# Patient Record
Sex: Female | Born: 1995 | Race: White | Hispanic: No | Marital: Single | Smoking: Never smoker
Health system: Southern US, Community
[De-identification: ages and names within clinical notes are randomized; demographics above are authoritative.]

---

## 2017-01-17 ENCOUNTER — Ambulatory Visit (INDEPENDENT_AMBULATORY_CARE_PROVIDER_SITE_OTHER): Payer: Managed Care, Other (non HMO) | Admitting: Family Medicine

## 2017-01-17 ENCOUNTER — Encounter: Payer: Self-pay | Admitting: Family Medicine

## 2017-01-17 ENCOUNTER — Ambulatory Visit (INDEPENDENT_AMBULATORY_CARE_PROVIDER_SITE_OTHER): Payer: Managed Care, Other (non HMO)

## 2017-01-17 VITALS — BP 126/62 | HR 118 | Temp 98.5°F | Ht 62.0 in | Wt 108.2 lb

## 2017-01-17 DIAGNOSIS — R0789 Other chest pain: Secondary | ICD-10-CM | POA: Diagnosis not present

## 2017-01-17 DIAGNOSIS — Z0001 Encounter for general adult medical examination with abnormal findings: Secondary | ICD-10-CM | POA: Diagnosis not present

## 2017-01-17 DIAGNOSIS — Z0000 Encounter for general adult medical examination without abnormal findings: Secondary | ICD-10-CM

## 2017-01-17 LAB — COMPREHENSIVE METABOLIC PANEL
ALT: 10 U/L (ref 0–35)
AST: 13 U/L (ref 0–37)
Albumin: 4.8 g/dL (ref 3.5–5.2)
Alkaline Phosphatase: 56 U/L (ref 39–117)
BUN: 13 mg/dL (ref 6–23)
CO2: 28 mEq/L (ref 19–32)
Calcium: 9.5 mg/dL (ref 8.4–10.5)
Chloride: 104 mEq/L (ref 96–112)
Creatinine, Ser: 0.85 mg/dL (ref 0.40–1.20)
GFR: 89.47 mL/min (ref >60.00)
Glucose, Bld: 102 mg/dL — ABNORMAL HIGH (ref 70–99)
Potassium: 4.1 mEq/L (ref 3.5–5.1)
Sodium: 139 mEq/L (ref 135–145)
Total Bilirubin: 0.5 mg/dL (ref 0.2–1.2)
Total Protein: 7.6 g/dL (ref 6.0–8.3)

## 2017-01-17 LAB — CBC WITH DIFFERENTIAL/PLATELET
Basophils Absolute: 0 10*3/uL (ref 0.0–0.1)
Basophils Relative: 0.5 % (ref 0.0–3.0)
Eosinophils Absolute: 0.3 10*3/uL (ref 0.0–0.7)
Eosinophils Relative: 5 % (ref 0.0–5.0)
HCT: 40.8 % (ref 36.0–46.0)
Hemoglobin: 13.9 g/dL (ref 12.0–15.0)
Lymphocytes Relative: 33.6 % (ref 12.0–46.0)
Lymphs Abs: 1.8 10*3/uL (ref 0.7–4.0)
MCHC: 34 g/dL (ref 30.0–36.0)
MCV: 86.2 fl (ref 78.0–100.0)
Monocytes Absolute: 0.4 10*3/uL (ref 0.1–1.0)
Monocytes Relative: 7.5 % (ref 3.0–12.0)
Neutro Abs: 2.9 10*3/uL (ref 1.4–7.7)
Neutrophils Relative %: 53.4 % (ref 43.0–77.0)
Platelets: 261 10*3/uL (ref 150.0–400.0)
RBC: 4.73 Mil/uL (ref 3.87–5.11)
RDW: 12.3 % (ref 11.5–15.5)
WBC: 5.5 10*3/uL (ref 4.0–10.5)

## 2017-01-17 LAB — POCT URINE PREGNANCY: Preg Test, Ur: NEGATIVE

## 2017-01-17 MED ORDER — IBUPROFEN-FAMOTIDINE 800-26.6 MG PO TABS: 1.0000 | ORAL_TABLET | Freq: Two times a day (BID) | ORAL | 0 refills | Status: DC

## 2017-01-19 ENCOUNTER — Telehealth: Payer: Self-pay | Admitting: Family Medicine

## 2017-01-19 ENCOUNTER — Encounter: Payer: Self-pay | Admitting: Sports Medicine

## 2017-01-19 ENCOUNTER — Ambulatory Visit: Payer: Managed Care, Other (non HMO) | Admitting: Sports Medicine

## 2017-01-19 VITALS — BP 104/68 | HR 100 | Ht 62.0 in | Wt 108.8 lb

## 2017-01-19 DIAGNOSIS — M259 Joint disorder, unspecified: Secondary | ICD-10-CM | POA: Diagnosis not present

## 2017-01-19 DIAGNOSIS — M9901 Segmental and somatic dysfunction of cervical region: Secondary | ICD-10-CM

## 2017-01-19 DIAGNOSIS — M9908 Segmental and somatic dysfunction of rib cage: Secondary | ICD-10-CM

## 2017-01-19 DIAGNOSIS — M25512 Pain in left shoulder: Secondary | ICD-10-CM

## 2017-01-19 DIAGNOSIS — M9902 Segmental and somatic dysfunction of thoracic region: Secondary | ICD-10-CM | POA: Diagnosis not present

## 2017-01-19 DIAGNOSIS — G2589 Other specified extrapyramidal and movement disorders: Secondary | ICD-10-CM | POA: Diagnosis not present

## 2017-01-19 MED ORDER — IBUPROFEN-FAMOTIDINE 800-26.6 MG PO TABS: 1.0000 | ORAL_TABLET | Freq: Three times a day (TID) | ORAL | 2 refills | Status: AC | PRN

## 2017-01-19 NOTE — Procedures (Signed)
PROCEDURE NOTE: THERAPEUTIC EXERCISES (97110) 15 minutes spent for Therapeutic exercises as below and as referenced in the AVS. This included exercises focusing on stretching, strengthening, with significant focus on eccentric aspects.  Proper technique shown and discussed handout in great detail with ATC. All questions were discussed and answered.   Long term goals include an improvement in range of motion, strength, endurance as well as avoiding reinjury. Frequency of visits is one time as determined during today's  office visit. Frequency of exercises to be performed is as per handout.  EXERCISES REVIEWED:  Posterior chain exercises  Stabilization exercises  Goodman exercises  Anterior chain stretching

## 2017-01-19 NOTE — Assessment & Plan Note (Signed)
I suspect the majority of her symptoms are coming from significant anterior chain dominance with underlying scapular dyskinesis.  If any localizing symptoms to her shoulder further evaluation could be considered for potential underlying labral tear but the crepitation appreciating I believe is [facility].  Therapeutic exercises reviewed in detail per procedure note

## 2017-01-19 NOTE — Telephone Encounter (Signed)
Pt returned call and lab results given to her per pcp request.

## 2017-01-19 NOTE — Patient Instructions (Addendum)
Also check out [employer]"Foundation Training" which is a program developed by Dr. Myles LippsEric Goodman.   There are links to a couple of his YouTube Videos below and I would like to see performing one of his videos 5-6 days per week.    A good intro video is: "Independence from Pain 7-minute Video" - [email or web address]   His more advanced video is: "Powerful Posture and Pain Relief: 12 minutes of Foundation Training" - https://youtu.be/[email or web address]  Do not try to attempt this entire video when first beginning.    Try breaking of each exercise that he goes into shorter segments.  Otherwise if they perform an exercise for 45 seconds, start with 15 seconds and rest and then resume when they begin the new activity.    If you work your way up to doing this 12 minute video, I expect you will see significant improvements in your pain.  If you enjoy his videos and would like to find out more you can look on his website: [email or web address].  He has a workout streaming option as well as a DVD set available for purchase.  Amazon has the best price for his DVDs.     Please perform the exercise program that we have prepared for you and gone over in detail on a daily basis.  In addition to the handout you were provided you can access your program through: [email or web address]   Your unique program code is: [email or web address]

## 2017-01-19 NOTE — Assessment & Plan Note (Signed)
Left AC joint is dysfunctional with likely laxity secondary to repeat self manipulation.  Recommend trying to minimize how often she is doing this with focus on posterior chain recruitment exercises.  We will plan to have her follow-up in 3 weeks for repeat evaluation and consideration of repeat osteopathic manipulation.

## 2017-01-19 NOTE — Progress Notes (Signed)
Cassandra Martin. [facility] [phone]  Cassandra Martin - 22 y.o. female MRN [id number]  Date of birth: 1995-08-19  Visit Date: 01/19/2017  PCP: Helane Rima, DO   Referred by: Helane Rima, DO   Scribe for today's visit: Christoper Fabian, LAT, ATC     SUBJECTIVE:  Cassandra Martin is here for New Patient (Initial Visit) (L chest and rib pain) .  Referred by: Dr. Earlene Plater  Her L chest and rib symptoms INITIALLY: Began Sept 2018 w/ no known MOI. Described as 3/10 aching pain but will get sharp when she "pops" her L ribs/chest, nonradiating Worsened with sleeping on her L side Improved with popping/cracking her L chest.  She describes self-manipulation by rotating her trunk to the L. Additional associated symptoms include: Tightness and discomfort but no significant shortness of breath dizziness, lightheadedness or exertional component    At this time symptoms show no change compared to onset  She has been taking Duexis prescribed by Dr. Earlene Plater on 01/17/17.     ROS Denies night time disturbances. Denies fevers, chills, or night sweats. Denies unexplained weight loss. Denies personal history of cancer. Denies changes in bowel or bladder habits. Denies recent unreported falls. Denies new or worsening dyspnea or wheezing. Denies headaches or dizziness.  Denies numbness, tingling or weakness  In the extremities.  Denies dizziness or presyncopal episodes Denies lower extremity edema     HISTORY & PERTINENT PRIOR DATA:  Prior History reviewed and updated per electronic medical record.  Significant history, findings, studies and interim changes include:  reports that  has never smoked. she has never used smokeless tobacco. No results for input(s): HGBA1C, LABURIC, CREATINE in the last 8760 hours. No specialty comments available. Problem  Disorder of Sternoclavicular Joint  Scapular Dyskinesis    OBJECTIVE:  VS:   HT:5\' 2"  (157.5 cm)   WT:108 lb 12.8 oz (49.4 kg)  BMI:19.89    BP:104/68  HR:100bpm  TEMP: ( )  RESP:99 %   PHYSICAL EXAM: Constitutional: WDWN, Non-toxic appearing. Psychiatric: Alert & appropriately interactive. Not depressed or anxious appearing. Respiratory: No increased work of breathing. Trachea Midline Eyes: Pupils are equal. EOM intact without nystagmus. No scleral icterus Cardiovascular:  Peripheral Pulses: peripheral pulses symmetrical No clubbing or cyanosis appreciated Capillary Refill is normal, less than 2 seconds No signficant generalized edema/anasarca  Sensory Exam: intact to light touch  Left Shoulder Exam: Normal alignment & Contours Skin: No overlying erythema/ecchymosis Neck: normal range of motion and supple Axial loading and circumduction produces: Crepitation with axial loading of the [place] joint as well as the AC joint.  Fine grinding with this but pain-free.  Non tender to palpation over: globally -   Lax shoulder joint in general. Drop arm test: negative Hawkins: normal, no pain  Neers: normal, no pain   Internal Rotation:   with no pain Strength: Normal External Rotation:  ROM: Normal with no pain Strength: Normal  Empty can: normal, no pain Strength: 4/5 Speeds: normal, no pain Strength: 4/5 O'Briens: normal, no pain Strength: 4/5  Significant scapular dyskinesis with abduction.  Anterior chain dominance.  No additional findings.   ASSESSMENT & PLAN:   1. Acute pain of left shoulder   2. Disorder of sternoclavicular joint   3. Scapular dyskinesis   4. Segmental and somatic dysfunction of cervical region   5. Segmental and somatic dysfunction of thoracic region   6. Segmental and somatic dysfunction of rib cage  PLAN:    Scapular dyskinesis I suspect the majority of her symptoms are coming from significant anterior chain dominance with underlying scapular dyskinesis.  If any localizing symptoms to her shoulder further evaluation could be  considered for potential underlying labral tear but the crepitation appreciating I believe is [facility].  Therapeutic exercises reviewed in detail per procedure note  Disorder of sternoclavicular joint Left AC joint is dysfunctional with likely laxity secondary to repeat self manipulation.  Recommend trying to minimize how often she is doing this with focus on posterior chain recruitment exercises.  We will plan to have her follow-up in 3 weeks for repeat evaluation and consideration of repeat osteopathic manipulation.   ++++++++++++++++++++++++++++++++++++++++++++ Orders & Meds: Orders Placed This Encounter  Procedures  . OSTEOPATHIC MANIPULATION TREATMENT  . Misc procedure    Meds ordered this encounter  Medications  . Ibuprofen-Famotidine (DUEXIS) 800-26.6 MG TABS    Sig: Take 1 tablet by mouth 3 (three) times daily as needed. 1 tab po tid X 14 days then 1 tab po tid as needed    Dispense:  90 tablet    Refill:  2    Home Phone      [phone] Mobile          [phone]     ++++++++++++++++++++++++++++++++++++++++++++ Follow-up: Return in about 3 weeks (around 02/09/2017).   Pertinent documentation may be included in additional procedure notes, imaging studies, problem based documentation and patient instructions. Please see these sections of the encounter for additional information regarding this visit. CMA/ATC served as Neurosurgeonscribe during this visit. History, Physical, and Plan performed by medical provider. Documentation and orders reviewed and attested to.      Andrena MewsMichael D Jatavis Malek, DO    [facility]

## 2017-01-19 NOTE — Telephone Encounter (Signed)
Please be advised. No further action required.

## 2017-01-19 NOTE — Procedures (Signed)
PROCEDURE NOTE : OSTEOPATHIC MANIPULATION The decision today to treat with Osteopathic Manipulative Therapy (OMT) was based on physical exam findings. Verbal consent was obtained after after explanation of risks, benefits and potential side effects, including acute pain flare, post manipulation soreness and need for repeat treatments.   Additional time was spent discussing the minimal risk of  injury to neurovascular structures for associated Cervical manipulation.  After verbal consent was obtained manipulation was performed as below:  Contraindications to OMT reviewed and include: NONE.             Regions treated: Per examined regions as below and associated billing codes          Techniques used: [employer], MFR, HVLA and ART The patient tolerated the treatment well and reported Improved symptoms following treatment today. Patient was given medications, exercises, stretches and lifestyle modifications per AVS and verbally.     OSTEOPATHIC/STRUCTURAL EXAM FINDINGS:    C2 rotated right C3 through C5 rotated left, side bent right  C6 FRS right  C7 ERS left  T1 FRS right  T2 through T8 or neutral rotated left, side bent right with a FRS right T5  6 through T8 neutral rotated left, side bent right  Posterior rib 2 on the left  Elevated first rib on the right  SI joint dysfunction and AC joint dysfunction on the left

## 2017-01-21 NOTE — Progress Notes (Signed)
Subjective:    Ike Benemanda Lauf is a 22 y.o. female and is here for a comprehensive physical exam.  Pertinent Gynecological History: Patient's last menstrual period was 01/16/2017 (exact date). Sexually active: No. Menses: regular every month without intermenstrual spotting.  Health Maintenance Due  Topic Date Due  . HIV Screening  09/26/2010  . TETANUS/TDAP  09/26/2014  . PAP SMEAR  09/25/2016   PMHx, SurgHx, SocialHx, Medications, and Allergies were reviewed in the Visit Navigator and updated as appropriate.   History reviewed. No pertinent past medical history. History reviewed. No pertinent surgical history.  Family History  Problem Relation Age of Onset  . Asthma Mother   . Arthritis Father   . Arthritis Maternal Grandmother   . Arthritis Maternal Grandfather   . Cancer Maternal Grandfather   . Cancer Paternal Grandmother   . Arthritis Paternal Grandfather    Social History   Tobacco Use  . Smoking status: Never Smoker  . Smokeless tobacco: Never Used  Substance Use Topics  . Alcohol use: No    Frequency: Never  . Drug use: No   Review of Systems:   Pertinent items are noted in the HPI. Otherwise, ROS is negative.  Objective:   BP 126/62   Pulse (!) 118   Temp 98.5 F (36.9 C) (Oral)   Ht 5\' 2"  (1.575 m)   Wt 108 lb 3.2 oz (49.1 kg)   LMP 01/16/2017 (Exact Date)   SpO2 98%   BMI 19.79 kg/m    Wt Readings from Last 3 Encounters:  01/19/17 108 lb 12.8 oz (49.4 kg)  01/17/17 108 lb 3.2 oz (49.1 kg)     Ht Readings from Last 3 Encounters:  01/19/17 5\' 2"  (1.575 m)  01/17/17 5\' 2"  (1.575 m)   General appearance: alert, cooperative and appears stated age. Head: normocephalic, without obvious abnormality, atraumatic. Neck: no adenopathy, supple, symmetrical, trachea midline; thyroid not enlarged, symmetric, no tenderness/mass/nodules. Lungs: clear to auscultation bilaterally. Heart: regular rate and rhythm Chest wall: mild ttp left costochondral  junction Abdomen: soft, non-tender; no masses,  no organomegaly. Extremities: extremities normal, atraumatic, no cyanosis or edema. Skin: skin color, texture, turgor normal, no rashes or lesions. Lymph: cervical, supraclavicular, and axillary nodes normal; no abnormal inguinal nodes palpated. Neurologic: grossly normal.  Assessment/Plan:   Marchelle Folksmanda was seen today for annual exam.  Diagnoses and all orders for this visit:  Routine physical examination -     CBC with Differential/Platelet -     Comprehensive metabolic panel  Atypical chest pain - chest wall. Comments: Left chest pain, intermittent, feels tight. Made better by rotating her trunk and "popping" her ribs. No initial injury. She sleeps in a fetal position. No history of evaluation or treatment. Exam reassuring, but puzzling. CXR WNL. Will start NSAID and have her follow up with Dr. Berline Choughigby.  Orders: -     DG Chest 2 View - WNL -     POCT urine pregnancy -     Ibuprofen-Famotidine (DUEXIS) 800-26.6 MG TABS; Take 1 tablet by mouth 2 (two) times daily.  Patient Counseling:   [x]     Nutrition: Stressed importance of moderation in sodium/caffeine intake, saturated fat and cholesterol, caloric balance, sufficient intake of fresh fruits, vegetables, fiber, calcium, iron, and 1 mg of folate supplement per day (for females capable of pregnancy).   [x]      Stressed the importance of regular exercise.    [x]     Substance Abuse: Discussed cessation/primary prevention of tobacco, alcohol, or  other drug use; driving or other dangerous activities under the influence; availability of treatment for abuse.    [x]      Injury prevention: Discussed safety belts, safety helmets, smoke detector, smoking near bedding or upholstery.    [x]      Sexuality: Discussed sexually transmitted diseases, partner selection, use of condoms, avoidance of unintended pregnancy  and contraceptive alternatives.    [x]     Dental health: Discussed importance  of regular tooth brushing, flossing, and dental visits.   [x]      Health maintenance and immunizations reviewed. Please refer to Health maintenance section.   Helane Rima, DO [facility]

## 2017-02-10 ENCOUNTER — Ambulatory Visit: Payer: Managed Care, Other (non HMO) | Admitting: Sports Medicine

## 2017-02-10 ENCOUNTER — Encounter: Payer: Self-pay | Admitting: Sports Medicine

## 2017-02-10 VITALS — BP 120/72 | HR 101 | Ht 62.0 in | Wt 112.4 lb

## 2017-02-10 DIAGNOSIS — M25512 Pain in left shoulder: Secondary | ICD-10-CM | POA: Diagnosis not present

## 2017-02-10 DIAGNOSIS — G2589 Other specified extrapyramidal and movement disorders: Secondary | ICD-10-CM | POA: Diagnosis not present

## 2017-02-10 DIAGNOSIS — M259 Joint disorder, unspecified: Secondary | ICD-10-CM

## 2017-02-10 NOTE — Assessment & Plan Note (Signed)
She has had slight improvement with this over the past several weeks.  She has been continuing to perform exercises only intermittently but notices it when she does state and they do seem to be beneficial.  She is continued to have some pain as well as new pain along the right AC joint that is worse with excessive overhead lifting.  This is likely related to continued scapular dyskinesis she has no significant pain with labral testing.  If any lack of improvement will consider further diagnostic evaluation with x-rays and/or MRIs but feels that we can defer this at this time.  Anti-inflammatories as needed.

## 2017-02-10 NOTE — Progress Notes (Signed)
[name]. [name] at [facility] [phone]  Cassandra Martin - 22 y.o. female MRN [id number]  Date of birth: Jan 08, 1996  Visit Date: 02/10/2017  PCP: Helane Rima, DO   Referred by: Helane Rima, DO   Scribe for today's visit: Stevenson Clinch, CMA     SUBJECTIVE:  Cassandra Martin is here for Follow-up (L shoulder and [place] joint pain) .   Notes from initial visit on 01/19/17: Her L chest and rib symptoms INITIALLY: Began Sept 2018 w/ no known MOI. Described as 3/10 aching pain but will get sharp when she "pops" her L ribs/chest, nonradiating Worsened with sleeping on her L side Improved with popping/cracking her L chest.  She describes self-manipulation by rotating her trunk to the L. Additional associated symptoms include: Tightness and discomfort but no significant shortness of breath dizziness, lightheadedness or exertional component    At this time symptoms show no change compared to onset  She has been taking Duexis prescribed by Dr. Earlene Plater on 01/17/17.     Compared to the last office visit on 01/19/17, her previously described L shoulder pain symptoms: are improving, pain is less severe and occurring less often.  Current symptoms are moderate sharp pain & are nonradiating. She continues to have popping.  She has been taking Duexis (twice daily) with minimal relief. She has OMT at last visit but continues to have shoulder pain. She has benefited from home therapeutic exercises.    ROS Denies night time disturbances. Denies fevers, chills, or night sweats. Denies unexplained weight loss. Denies personal history of cancer. Denies changes in bowel or bladder habits. Denies recent unreported falls. Denies new or worsening dyspnea or wheezing. Denies headaches or dizziness.  Denies numbness, tingling or weakness  In the extremities.  Denies dizziness or presyncopal episodes Denies lower extremity edema     HISTORY  & PERTINENT PRIOR DATA:  Prior History reviewed and updated per electronic medical record.  Significant history, findings, studies and interim changes include:  reports that  has never smoked. she has never used smokeless tobacco. No results for input(s): HGBA1C, LABURIC, CREATINE in the last 8760 hours. No specialty comments available. Problem  Disorder of Sternoclavicular Joint    OBJECTIVE:  VS:  HT:5\' 2"  (157.5 cm)   WT:112 lb 6.4 oz (51 kg)  BMI:20.55    BP:120/72  HR:(!) 101bpm  TEMP: ( )  RESP:99 %   PHYSICAL EXAM: Constitutional: WDWN, Non-toxic appearing. Psychiatric: Alert & appropriately interactive.  Not depressed or anxious appearing. Respiratory: No increased work of breathing.  Trachea Midline Eyes: Pupils are equal.  EOM intact without nystagmus.  No scleral icterus  NEUROVASCULAR exam: No clubbing or cyanosis appreciated No significant venous stasis changes Capillary Refill: normal, less than 2 seconds   Left shoulder still held in significant however overall posture is slightly improved.  She has persistent popping is only intermittent and difficult to reproduce on exam today which is improved.  Her shoulder does have significant laxity suggesting possible multidirectional instability.  Positive jerk test and Jobes relocation test.    ASSESSMENT & PLAN:   1. Acute pain of left shoulder   2. Disorder of sternoclavicular joint   3. Scapular dyskinesis    ++++++++++++++++++++++++++++++++++++++++++++ Orders & Meds: No orders of the defined types were placed in this encounter.  No orders of the defined types were placed in this encounter.   ++++++++++++++++++++++++++++++++++++++++++++ PLAN:    Disorder of sternoclavicular joint She has had slight  improvement with this over the past several weeks.  She has been continuing to perform exercises only intermittently but notices it when she does state and they do seem to be beneficial.  She is continued to have  some pain as well as new pain along the right AC joint that is worse with excessive overhead lifting.  This is likely related to continued scapular dyskinesis she has no significant pain with labral testing.  If any lack of improvement will consider further diagnostic evaluation with x-rays and/or MRIs but feels that we can defer this at this time.  Anti-inflammatories as needed.   Follow-up: Return in about 10 weeks (around 04/21/2017).   Pertinent documentation may be included in additional procedure notes, imaging studies, problem based documentation and patient instructions. Please see these sections of the encounter for additional information regarding this visit. CMA/ATC served as Neurosurgeonscribe during this visit. History, Physical, and Plan performed by medical provider. Documentation and orders reviewed and attested to.      Andrena MewsMichael D Yosiel Thieme, DO    [place] [facility] Physician

## 2017-03-28 ENCOUNTER — Ambulatory Visit: Payer: Managed Care, Other (non HMO) | Admitting: Family Medicine

## 2017-03-28 ENCOUNTER — Encounter: Payer: Self-pay | Admitting: Family Medicine

## 2017-03-28 VITALS — BP 146/70 | HR 123 | Temp 97.9°F | Ht 62.0 in | Wt 106.0 lb

## 2017-03-28 DIAGNOSIS — R030 Elevated blood-pressure reading, without diagnosis of hypertension: Secondary | ICD-10-CM

## 2017-03-28 DIAGNOSIS — R05 Cough: Secondary | ICD-10-CM

## 2017-03-28 DIAGNOSIS — J029 Acute pharyngitis, unspecified: Secondary | ICD-10-CM | POA: Diagnosis not present

## 2017-03-28 DIAGNOSIS — R059 Cough, unspecified: Secondary | ICD-10-CM

## 2017-03-28 LAB — POCT RAPID STREP A (OFFICE): RAPID STREP A SCREEN: NEGATIVE

## 2017-03-28 MED ORDER — IPRATROPIUM BROMIDE 0.06 % NA SOLN: 2.0000 | Freq: Four times a day (QID) | NASAL | 0 refills | Status: AC

## 2017-03-28 MED ORDER — BENZONATATE 200 MG PO CAPS: 200.0000 mg | ORAL_CAPSULE | Freq: Two times a day (BID) | ORAL | 0 refills | Status: AC | PRN

## 2017-03-28 MED ORDER — AZITHROMYCIN 250 MG PO TABS: ORAL_TABLET | ORAL | 0 refills | Status: AC

## 2017-03-28 NOTE — Patient Instructions (Signed)
Start the atrovent.  Start tessalon for your cough.  Start the zpack if your symptoms worsen or do not improve in a few days.  Please stay well hydrated.  You can take tylenol and/or motrin as needed for low grade fever and pain.  Please let me know if your symptoms worsen or fail to improve.  Take care, Dr Parker  

## 2017-03-28 NOTE — Progress Notes (Addendum)
    Subjective:  Cassandra Martin is a 22 y.o. female who presents today for same-day appointment with a chief complaint of cough.   HPI:  Cough, Acute issue Started 4 days ago. Worsened over that time.  Associated with sore throat, rhinorrhea, and congestion. Occasional myalgias. No fevers or chills. She tried taking Zicam and DayQuil which did not help.  No sick contacts.  No obvious alleviating or aggravating factors.  ROS: Per HPI  PMH: She reports that  has never smoked. she has never used smokeless tobacco. She reports that she does not drink alcohol or use drugs.  Objective:  Physical Exam: BP (!) 146/70 (BP Location: Right Arm)   Pulse (!) 123   Temp 97.9 F (36.6 C)   Ht 5\' 2"  (1.575 m)   Wt 106 lb (48.1 kg)   SpO2 100%   BMI 19.39 kg/m   Gen: NAD, resting comfortably HEENT: TMs clear bilaterally.  Oropharynx erythematous without exudate.  Nosal mucosa boggy and erythematous bilaterally with clear nasal discharge.  Maxillary sinuses clear to transillumination bilaterally. CV: RRR with no murmurs appreciated Pulm: NWOB, CTAB with no crackles, wheezes, or rhonchi  Rapid Strep: Negative.    Assessment/Plan:  Cough Likely secondary to viral URI. No signs of bacterial infection. Start atrovent for rhinorrhea/sinus congestion. Start tessalon for cough. Sent in a "pocket prescription" for azithromycin with strict instruction to not start unless symptoms worsen or fail to improve within the next several days. Recommended tylenol and/or motrin as needed for low grade fever and pain. Encouraged good oral hydration. Return precautions reviewed. Follow up as needed.   Elevated BP Reading Elevated today in setting of acute illness. Previously well controlled. No indication to start treatment today, however discussed home blood pressure monitoring with goal 140/90 or lower.   Katina Degreealeb M. Jimmey RalphParker, MD 03/28/2017 2:34 PM

## 2017-04-21 ENCOUNTER — Ambulatory Visit: Payer: Managed Care, Other (non HMO) | Admitting: Family Medicine

## 2019-04-04 IMAGING — DX DG CHEST 2V
2 series · 2 of 2 positions shown · non-contrast
Comparison: None.

CLINICAL DATA: Left upper chest and rib pain for 6 months.

EXAM:
CHEST  2 VIEW

[chest pa]
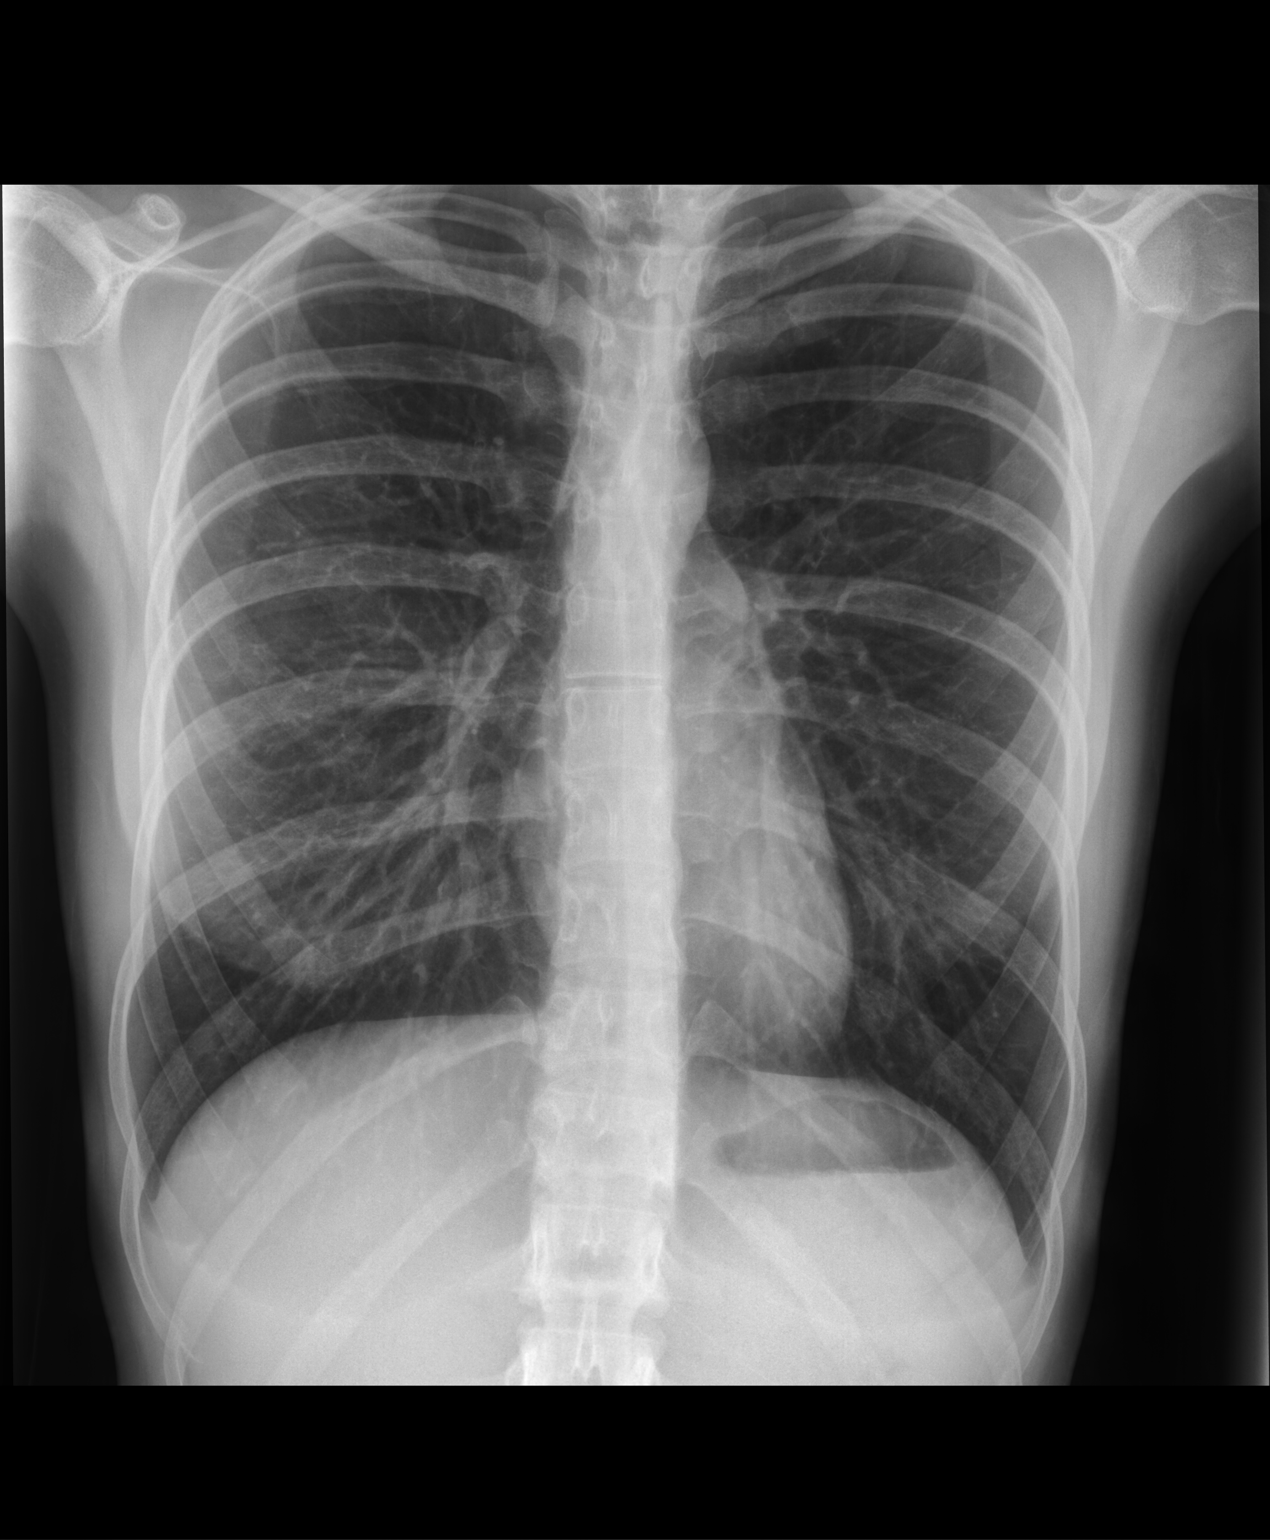

[chest lat]
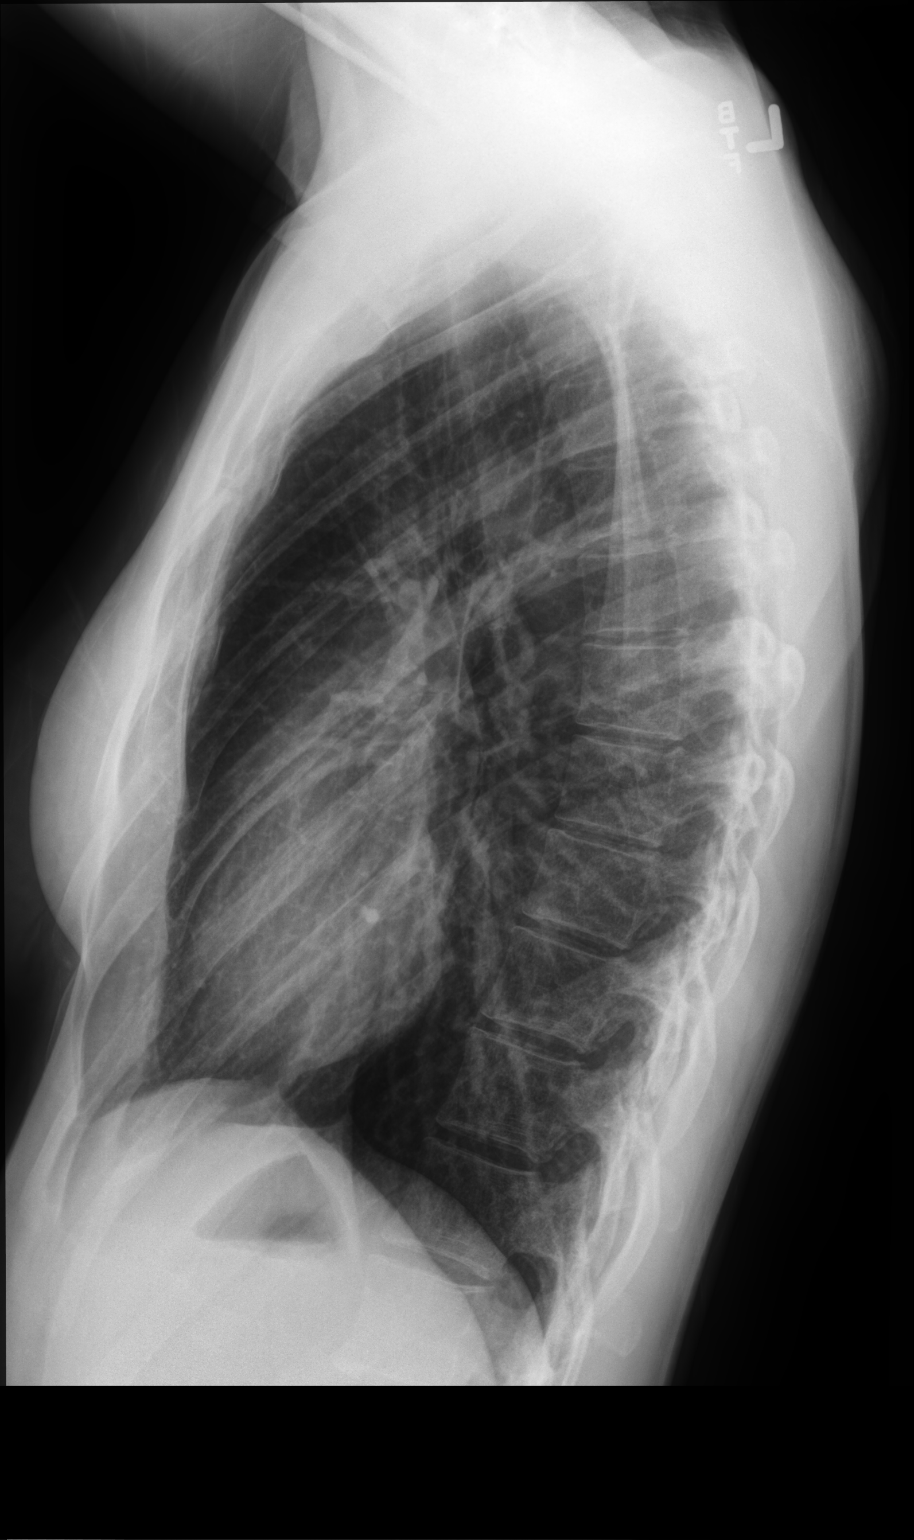

[2 of 2 positions shown; findings below may reference images not displayed]

FINDINGS: The heart size and mediastinal contours are within normal limits.
Both lungs are clear. The visualized skeletal structures are
unremarkable.
IMPRESSION: Negative two view chest x-ray
